# Patient Record
Sex: Female | Born: 2000 | Race: White | Hispanic: No | Marital: Single | Smoking: Light tobacco smoker
Health system: Southern US, Community
[De-identification: ages and names within clinical notes are randomized; demographics above are authoritative.]

---
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
## 2019-06-05 ENCOUNTER — Emergency Department (HOSPITAL_COMMUNITY)
Admission: EM | Admit: 2019-06-05 | Discharge: 2019-06-05 | Disposition: A | Discharge status: Home / Self Care | Payer: BC Managed Care – PPO | Attending: Emergency Medicine | Admitting: Emergency Medicine

## 2019-06-05 ENCOUNTER — Emergency Department (HOSPITAL_COMMUNITY): Payer: BC Managed Care – PPO

## 2019-06-05 ENCOUNTER — Encounter (HOSPITAL_COMMUNITY): Payer: Self-pay | Admitting: Emergency Medicine

## 2019-06-05 DIAGNOSIS — M25562 Pain in left knee: Secondary | ICD-10-CM | POA: Insufficient documentation

## 2019-06-05 DIAGNOSIS — F17210 Nicotine dependence, cigarettes, uncomplicated: Secondary | ICD-10-CM | POA: Insufficient documentation

## 2019-06-05 DIAGNOSIS — Y929 Unspecified place or not applicable: Secondary | ICD-10-CM | POA: Insufficient documentation

## 2019-06-05 DIAGNOSIS — Y93I9 Activity, other involving external motion: Secondary | ICD-10-CM | POA: Insufficient documentation

## 2019-06-05 DIAGNOSIS — Y999 Unspecified external cause status: Secondary | ICD-10-CM | POA: Diagnosis not present

## 2019-06-05 MED ORDER — HYDROCODONE-ACETAMINOPHEN 5-325 MG PO TABS: 2.0000 | ORAL_TABLET | ORAL | 0 refills | Status: DC | PRN
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
## 2019-06-05 NOTE — Discharge Instructions (Addendum)
Please read attached information. If you experience any new or worsening signs or symptoms please return to the emergency room for evaluation. Please follow-up with your primary care provider or specialist as discussed. Please use medication prescribed only as directed and discontinue taking if you have any concerning signs or symptoms.   °
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
## 2022-05-10 ENCOUNTER — Emergency Department: Payer: BC Managed Care – PPO

## 2022-05-10 ENCOUNTER — Encounter: Payer: Self-pay | Admitting: Emergency Medicine

## 2022-05-10 ENCOUNTER — Emergency Department
Admission: EM | Admit: 2022-05-10 | Discharge: 2022-05-11 | Disposition: A | Discharge status: Home / Self Care | Payer: BC Managed Care – PPO | Attending: Emergency Medicine | Admitting: Emergency Medicine

## 2022-05-10 ENCOUNTER — Other Ambulatory Visit: Payer: Self-pay

## 2022-05-10 DIAGNOSIS — R112 Nausea with vomiting, unspecified: Secondary | ICD-10-CM | POA: Diagnosis present

## 2022-05-10 DIAGNOSIS — D72829 Elevated white blood cell count, unspecified: Secondary | ICD-10-CM | POA: Insufficient documentation

## 2022-05-10 DIAGNOSIS — Z20822 Contact with and (suspected) exposure to covid-19: Secondary | ICD-10-CM | POA: Diagnosis not present

## 2022-05-10 DIAGNOSIS — R7981 Abnormal blood-gas level: Secondary | ICD-10-CM | POA: Diagnosis not present

## 2022-05-10 DIAGNOSIS — K529 Noninfective gastroenteritis and colitis, unspecified: Secondary | ICD-10-CM | POA: Insufficient documentation

## 2022-05-10 LAB — URINALYSIS, ROUTINE W REFLEX MICROSCOPIC
Bilirubin Urine: NEGATIVE
Glucose, UA: NEGATIVE mg/dL
Hgb urine dipstick: NEGATIVE
Ketones, ur: NEGATIVE mg/dL
Leukocytes,Ua: NEGATIVE
Nitrite: NEGATIVE
Protein, ur: 30 mg/dL — AB
Specific Gravity, Urine: 1.025 (ref 1.005–1.030)
pH: 6 (ref 5.0–8.0)

## 2022-05-10 LAB — COMPREHENSIVE METABOLIC PANEL
ALT: 15 U/L (ref 0–44)
AST: 22 U/L (ref 15–41)
Albumin: 4.8 g/dL (ref 3.5–5.0)
Alkaline Phosphatase: 65 U/L (ref 38–126)
Anion gap: 11 (ref 5–15)
BUN: 9 mg/dL (ref 6–20)
CO2: 20 mmol/L — ABNORMAL LOW (ref 22–32)
Calcium: 9.5 mg/dL (ref 8.9–10.3)
Chloride: 107 mmol/L (ref 98–111)
Creatinine, Ser: 0.79 mg/dL (ref 0.44–1.00)
GFR, Estimated: >60 mL/min (ref >60)
Glucose, Bld: 105 mg/dL — ABNORMAL HIGH (ref 70–99)
Potassium: 3.6 mmol/L (ref 3.5–5.1)
Sodium: 138 mmol/L (ref 135–145)
Total Bilirubin: 1 mg/dL (ref 0.3–1.2)
Total Protein: 8.4 g/dL — ABNORMAL HIGH (ref 6.5–8.1)

## 2022-05-10 LAB — CBC
HCT: 40.8 % (ref 36.0–46.0)
Hemoglobin: 14 g/dL (ref 12.0–15.0)
MCH: 29.3 pg (ref 26.0–34.0)
MCHC: 34.3 g/dL (ref 30.0–36.0)
MCV: 85.4 fL (ref 80.0–100.0)
Platelets: 318 10*3/uL (ref 150–400)
RBC: 4.78 MIL/uL (ref 3.87–5.11)
RDW: 12.5 % (ref 11.5–15.5)
WBC: 16.2 10*3/uL — ABNORMAL HIGH (ref 4.0–10.5)
nRBC: 0 % (ref 0.0–0.2)

## 2022-05-10 LAB — URINALYSIS, MICROSCOPIC (REFLEX): Bacteria, UA: NONE SEEN

## 2022-05-10 LAB — POC URINE PREG, ED: Preg Test, Ur: NEGATIVE

## 2022-05-10 LAB — LIPASE, BLOOD: Lipase: 25 U/L (ref 11–51)

## 2022-05-10 MED ORDER — SODIUM CHLORIDE 0.9 % IV BOLUS
1000.0000 mL | Freq: Once | INTRAVENOUS | Status: AC
  Administered 2022-05-10: 1000 mL via INTRAVENOUS

## 2022-05-10 MED ORDER — ONDANSETRON HCL 4 MG/2ML IJ SOLN
4.0000 mg | Freq: Once | INTRAMUSCULAR | Status: AC
  Administered 2022-05-10: 4 mg via INTRAVENOUS
  Filled 2022-05-10: qty 2

## 2022-05-10 MED ORDER — IOHEXOL 300 MG/ML  SOLN
100.0000 mL | Freq: Once | INTRAMUSCULAR | Status: AC | PRN
  Administered 2022-05-10: 100 mL via INTRAVENOUS

## 2022-05-10 MED ORDER — MORPHINE SULFATE (PF) 4 MG/ML IV SOLN
4.0000 mg | Freq: Once | INTRAVENOUS | Status: AC
  Administered 2022-05-10: 4 mg via INTRAVENOUS
  Filled 2022-05-10: qty 1

## 2022-05-10 NOTE — ED Triage Notes (Signed)
Pt arrived via POV with c/o upper abd pain that started this morning, pt c/o multiple episodes of vomiting as well.

## 2022-05-10 NOTE — ED Provider Notes (Signed)
[facility] Provider Note    Event Date/Time   First MD Initiated Contact with Patient 05/10/22 2301     (approximate)  History   Chief Complaint: Abdominal Pain and Emesis  HPI  Christina Murray is a 21 y.o. female with no past medical history presents to the emergency department for upper abdominal pain nausea vomiting.  According to the patient they did drink alcohol yesterday which is atypical for her.  She woke up this morning very nauseated and vomited several times.  States she then shortly afterwards developed upper abdominal pain has been vomiting all day.  Patient is actively vomiting in the emergency department.  Patient denies any fever no dysuria or hematuria, no vaginal bleeding, last menstrual cycle was 2 to 3 weeks ago per patient.  No cough or congestion.  Denies any history of similar episodes in the past.  Physical Exam   Triage Vital Signs: ED Triage Vitals  Enc Vitals Group     BP 05/10/22 2240 139/86     Pulse Rate 05/10/22 2240 (!) 119     Resp 05/10/22 2240 (!) 22     Temp 05/10/22 2240 98.4 F (36.9 C)     Temp Source 05/10/22 2240 Oral     SpO2 05/10/22 2240 93 %     Weight 05/10/22 2238 150 lb (68 kg)     Height 05/10/22 2238 5\' 4"  (1.626 m)     Head Circumference --      Peak Flow --      Pain Score 05/10/22 2238 8     Pain Loc --      Pain Edu? --      Excl. in [place]? --     Most recent vital signs: Vitals:   05/10/22 2240  BP: 139/86  Pulse: (!) 119  Resp: (!) 22  Temp: 98.4 F (36.9 C)  SpO2: 93%    General: Awake, mild distress appears nauseated holding an emesis bag with active retching. CV:  Good peripheral perfusion.  Regular rate and rhythm  Resp:  Normal effort.  Equal breath sounds bilaterally.  Abd:  No distention.  Soft, fairly diffuse tenderness on my exam including bilateral CVA tenderness.  No rebound or guarding    ED Results / Procedures / Treatments   EKG  EKG viewed and interpreted by  myself shows a normal sinus rhythm at 80 bpm with a narrow QRS, normal axis, normal intervals, no concerning ST changes.  RADIOLOGY  I have reviewed and interpreted the CT images.  I do not see any obvious bowel obstruction or significant abnormality on my evaluation. Radiology has read the CT scan as possible enteritis.   MEDICATIONS ORDERED IN ED: Medications  morphine (PF) 4 MG/ML injection 4 mg (has no administration in time range)  ondansetron (ZOFRAN) injection 4 mg (has no administration in time range)  sodium chloride 0.9 % bolus 1,000 mL (has no administration in time range)     IMPRESSION / MDM / ASSESSMENT AND PLAN / ED COURSE  I reviewed the triage vital signs and the nursing notes.  Patient's presentation is most consistent with acute presentation with potential threat to life or bodily function.  Patient presents emergency department for abdominal pain nausea and vomiting since this morning.  Patient with active vomiting in the emergency department.  Patient's labs show normal lipase, normal chemistry with normal LFTs, slightly low bicarb.  CBC shows moderate leukocytosis of 16,000 otherwise no findings.  Pregnancy test  is negative, urinalysis is pending.  We will obtain a COVID swab.  We will treat with pain and nausea medication, IV hydrate given the patient's diffuse abdominal pain with leukocytosis we will also obtain CT imaging the abdomen/pelvis to further evaluate.  Patient agreeable to plan.  Patient's lab work is reassuring.  Urinalysis shows no concerning finding.  Pregnancy test is negative.  CT scan shows possible enteritis.  Patient received droperidol in the emergency department.  Now she is awake alert she is tolerating p.o. without issue.  States she feels much better and is ready to go home.  We will discharge patient home with nausea medication.  Provided return precautions.  FINAL CLINICAL IMPRESSION(S) / ED DIAGNOSES   Abdominal pain Nausea  vomiting Enteritis  Note:  This document was prepared using Dragon voice recognition software and may include unintentional dictation errors.   Harvest Dark, MD 05/11/22 [phone]

## 2022-05-11 DIAGNOSIS — K529 Noninfective gastroenteritis and colitis, unspecified: Secondary | ICD-10-CM | POA: Diagnosis not present

## 2022-05-11 LAB — RESP PANEL BY RT-PCR (FLU A&B, COVID) ARPGX2
Influenza A by PCR: NEGATIVE
Influenza B by PCR: NEGATIVE
SARS Coronavirus 2 by RT PCR: NEGATIVE

## 2022-05-11 MED ORDER — ONDANSETRON 4 MG PO TBDP: 4.0000 mg | ORAL_TABLET | Freq: Three times a day (TID) | ORAL | 0 refills | Status: AC | PRN

## 2022-05-11 MED ORDER — DROPERIDOL 2.5 MG/ML IJ SOLN
2.5000 mg | Freq: Once | INTRAMUSCULAR | Status: AC
  Administered 2022-05-11: 2.5 mg via INTRAVENOUS
  Filled 2022-05-11: qty 2

## 2023-08-19 ENCOUNTER — Emergency Department: Payer: Self-pay

## 2023-08-19 ENCOUNTER — Encounter: Payer: Self-pay | Admitting: Emergency Medicine

## 2023-08-19 ENCOUNTER — Emergency Department
Admission: EM | Admit: 2023-08-19 | Discharge: 2023-08-19 | Disposition: A | Discharge status: Home / Self Care | Payer: Self-pay | Attending: Emergency Medicine | Admitting: Emergency Medicine

## 2023-08-19 ENCOUNTER — Other Ambulatory Visit: Payer: Self-pay

## 2023-08-19 DIAGNOSIS — W540XXA Bitten by dog, initial encounter: Secondary | ICD-10-CM | POA: Insufficient documentation

## 2023-08-19 DIAGNOSIS — Z23 Encounter for immunization: Secondary | ICD-10-CM | POA: Insufficient documentation

## 2023-08-19 DIAGNOSIS — S61452A Open bite of left hand, initial encounter: Secondary | ICD-10-CM | POA: Insufficient documentation

## 2023-08-19 MED ORDER — AMOXICILLIN-POT CLAVULANATE 875-125 MG PO TABS
1.0000 | ORAL_TABLET | Freq: Once | ORAL | Status: AC
  Administered 2023-08-19: 1 via ORAL
  Filled 2023-08-19: qty 1

## 2023-08-19 MED ORDER — HYDROCODONE-ACETAMINOPHEN 5-325 MG PO TABS
1.0000 | ORAL_TABLET | Freq: Once | ORAL | Status: AC
  Administered 2023-08-19: 1 via ORAL
  Filled 2023-08-19: qty 1

## 2023-08-19 MED ORDER — HYDROCODONE-ACETAMINOPHEN 5-325 MG PO TABS: 1.0000 | ORAL_TABLET | Freq: Four times a day (QID) | ORAL | 0 refills | Status: AC | PRN

## 2023-08-19 MED ORDER — AMOXICILLIN-POT CLAVULANATE 875-125 MG PO TABS: 1.0000 | ORAL_TABLET | Freq: Two times a day (BID) | ORAL | 0 refills | Status: AC

## 2023-08-19 MED ORDER — TETANUS-DIPHTH-ACELL PERTUSSIS 5-2.5-18.5 LF-MCG/0.5 IM SUSY
0.5000 mL | PREFILLED_SYRINGE | Freq: Once | INTRAMUSCULAR | Status: AC
  Administered 2023-08-19: 0.5 mL via INTRAMUSCULAR
  Filled 2023-08-19: qty 0.5

## 2023-08-19 NOTE — ED Provider Notes (Signed)
 [facility] Emergency Department Provider Note     Event Date/Time   First MD Initiated Contact with Patient 08/19/23 1901     (approximate)   History   Animal Bite   HPI  Christina Murray is a 23 y.o. female presents to the ED for evaluation of dog bite to the left hand.  Patient reports she was clipping the fingernails of her neighbors dog when the dog hit her hand and a tooth was left in puncture wound.  Patient reports she did remove the tooth.  Patient reports numbness to tip of thumb.  Patient unknown tetanus status.  Dog is up-to-date on all vaccines and rabies.     Physical Exam   Triage Vital Signs: ED Triage Vitals  Encounter Vitals Group     BP 08/19/23 1550 133/85     Systolic BP Percentile --      Diastolic BP Percentile --      Pulse Rate 08/19/23 1550 (!) 111     Resp 08/19/23 1550 18     Temp 08/19/23 1550 98.3 F (36.8 C)     Temp Source 08/19/23 1550 Oral     SpO2 08/19/23 1550 100 %     Weight 08/19/23 1551 149 lb 14.6 oz (68 kg)     Height 08/19/23 1551 5' 4 (1.626 m)     Head Circumference --      Peak Flow --      Pain Score 08/19/23 1551 4     Pain Loc --      Pain Education --      Exclude from Growth Chart --     Most recent vital signs: Vitals:   08/19/23 1550  BP: 133/85  Pulse: (!) 111  Resp: 18  Temp: 98.3 F (36.8 C)  SpO2: 100%    General Awake, no distress.  HEENT NCAT. PERRL. EOMI. No rhinorrhea. Mucous membranes are moist.  CV:  Good peripheral perfusion.  RESP:  Normal effort.  ABD:  No distention.  Other:  Left hand reveals puncture wound at the thenar eminence of the volar aspect.  Bleeding is controlled.  Limited ROM of thumb due to edema.  Capillary refills brisk.  Sensation reported absent at distal thumb.   ED Results / Procedures / Treatments   Labs (all labs ordered are listed, but only abnormal results are displayed) Labs Reviewed - No data to display  RADIOLOGY  I  personally viewed and evaluated these images as part of my medical decision making, as well as reviewing the written report by the radiologist.  ED Provider Interpretation: no fracture  DG Hand Complete Left Result Date: 08/19/2023 CLINICAL DATA:  Dog bite EXAM: LEFT HAND - COMPLETE 3 VIEW COMPARISON:  None Available. FINDINGS: There is no evidence of fracture or dislocation. There is no evidence of arthropathy or other focal bone abnormality. Soft tissues swelling along the palmar soft tissues. No radiopaque foreign bodies. IMPRESSION: No acute fracture or radiopaque foreign bodies. Electronically Signed   By: Dirk Arrant M.D.   On: 08/19/2023 16:58    PROCEDURES:  Critical Care performed: No  Procedures   MEDICATIONS ORDERED IN ED: Medications  Tdap (BOOSTRIX) injection 0.5 mL (0.5 mLs Intramuscular Given 08/19/23 2010)  HYDROcodone -acetaminophen  (NORCO/VICODIN) 5-325 MG per tablet 1 tablet (1 tablet Oral Given 08/19/23 2009)  amoxicillin -clavulanate (AUGMENTIN ) 875-125 MG per tablet 1 tablet (1 tablet Oral Given 08/19/23 2009)     IMPRESSION / MDM / ASSESSMENT AND  PLAN / ED COURSE  I reviewed the triage vital signs and the nursing notes.                               23 y.o. female presents to the emergency department for evaluation and treatment of dog bite. See HPI for further details.   Differential diagnosis includes, but is not limited to puncture wound, cellulitis, fracture, foreign body  Patient's presentation is most consistent with acute complicated illness / injury requiring diagnostic workup.  Patient is alert and oriented.  Physical exam findings are stated above.  X-ray reassuring of fracture or foreign body.  Wound extensively cleaned and irrigated.  Dressing applied for compression.  Ice pack given.  Patient is educated on the importance of elevation.  Tetanus injection updated.  First dose of Augmentin  provided in ED.  Patient encouraged to follow-up with her  primary care for wound recheck.  ED return precautions discussed.  Patient is in stable and satisfactory condition for discharge home.   FINAL CLINICAL IMPRESSION(S) / ED DIAGNOSES   Final diagnoses:  Dog bite, initial encounter    Rx / DC Orders   ED Discharge Orders          Ordered    amoxicillin -clavulanate (AUGMENTIN ) 875-125 MG tablet  2 times daily        08/19/23 2008    HYDROcodone -acetaminophen  (NORCO/VICODIN) 5-325 MG tablet  Every 6 hours PRN        08/19/23 2008             Note:  This document was prepared using Dragon voice recognition software and may include unintentional dictation errors.    Margrette, Mitchelle Sultan A, PA-C 08/19/23 [id number]    Suzanne Kirsch, MD 08/20/23 [phone]

## 2023-08-19 NOTE — ED Notes (Signed)
 Patient discharged from ED by provider. Discharge instructions reviewed with patient and all questions answered. Patient ambulatory from ED in NAD.

## 2023-08-19 NOTE — ED Provider Triage Note (Addendum)
 Emergency Medicine Provider Triage Evaluation Note  Christina Murray , a 23 y.o. female  was evaluated in triage.  Pt complains of dog bite that occurred a few hours ago, states she cannot feel her left thumb now. She was bit by her neighbors dog who is up to date on his shots. She says the bite was really deep and she needed to pull the tooth out of her hand.  Review of Systems  Positive: Pain in the left hand, arm Negative:   Physical Exam  There were no vitals taken for this visit. Gen:   Awake, no distress   Resp:  Normal effort  MSK:   Moves extremities without difficulty  Other:  Puncture wound at the base of the left hand, not actively bleeding, does not have sensation at the thumb tip, capillary refill is appropriate, thenar eminence of left hand swollen when compared to the right  Medical Decision Making  Medically screening exam initiated at 3:49 PM.  Appropriate orders placed.  Geneen J Atayde was informed that the remainder of the evaluation will be completed by another provider, this initial triage assessment does not replace that evaluation, and the importance of remaining in the ED until their evaluation is complete.     Cleaster Tinnie LABOR, PA-C 08/19/23 1552    Cleaster Tinnie LABOR, PA-C 08/19/23 1555

## 2023-08-19 NOTE — ED Triage Notes (Signed)
 Patient to ED via POV for dog bite to left hand. Pt states she is unable to feel her thumb in that hand after bite. States dog was a neighbors and is up to date on vaccinations.

## 2023-08-19 NOTE — Discharge Instructions (Addendum)
 You were evaluated in the ED for an animal bite.  Your hand x-ray is normal.  Please keep area clean and dry.  Gently use soap and water.  Do not use alcohol, for hydrogen peroxide or any other source to clean wound.  Keep hand elevated at all times to help with swelling.  Apply ice to the affected area to help with swelling.  Take ibuprofen  for pain as needed.  Follow-up with your primary care for wound check.
